# Patient Record
Sex: Female | Born: 1975 | Race: White | Hispanic: No | Marital: Single | Smoking: Current some day smoker
Health system: Southern US, Community
[De-identification: ages and names within clinical notes are randomized; demographics above are authoritative.]

## PROBLEM LIST (undated history)

## (undated) DIAGNOSIS — Z720 Tobacco use: Secondary | ICD-10-CM

## (undated) DIAGNOSIS — R002 Palpitations: Secondary | ICD-10-CM

## (undated) HISTORY — DX: Palpitations: R00.2

## (undated) HISTORY — DX: Tobacco use: Z72.0

---

## 2013-09-13 HISTORY — PX: BREAST BIOPSY: SHX20

## 2016-12-27 ENCOUNTER — Other Ambulatory Visit: Payer: Self-pay | Admitting: Unknown Physician Specialty

## 2016-12-27 DIAGNOSIS — Z1231 Encounter for screening mammogram for malignant neoplasm of breast: Secondary | ICD-10-CM

## 2017-01-13 ENCOUNTER — Encounter: Payer: Self-pay | Admitting: Radiology

## 2017-01-13 ENCOUNTER — Ambulatory Visit
Admission: RE | Admit: 2017-01-13 | Discharge: 2017-01-13 | Disposition: A | Discharge status: Home / Self Care | Payer: BC Managed Care – PPO | Source: Ambulatory Visit | Attending: Unknown Physician Specialty | Admitting: Unknown Physician Specialty

## 2017-01-13 DIAGNOSIS — Z1231 Encounter for screening mammogram for malignant neoplasm of breast: Secondary | ICD-10-CM

## 2017-01-17 ENCOUNTER — Other Ambulatory Visit: Payer: Self-pay | Admitting: Unknown Physician Specialty

## 2017-01-17 DIAGNOSIS — R928 Other abnormal and inconclusive findings on diagnostic imaging of breast: Secondary | ICD-10-CM

## 2017-01-28 ENCOUNTER — Other Ambulatory Visit: Payer: BC Managed Care – PPO

## 2017-01-28 ENCOUNTER — Ambulatory Visit
Admission: RE | Admit: 2017-01-28 | Discharge: 2017-01-28 | Disposition: A | Discharge status: Home / Self Care | Payer: BC Managed Care – PPO | Source: Ambulatory Visit | Attending: Unknown Physician Specialty | Admitting: Unknown Physician Specialty

## 2017-01-28 ENCOUNTER — Other Ambulatory Visit: Payer: Self-pay | Admitting: Unknown Physician Specialty

## 2017-01-28 DIAGNOSIS — R928 Other abnormal and inconclusive findings on diagnostic imaging of breast: Secondary | ICD-10-CM

## 2017-01-28 DIAGNOSIS — N6320 Unspecified lump in the left breast, unspecified quadrant: Secondary | ICD-10-CM

## 2017-07-08 ENCOUNTER — Ambulatory Visit (INDEPENDENT_AMBULATORY_CARE_PROVIDER_SITE_OTHER): Payer: BC Managed Care – PPO

## 2017-07-08 ENCOUNTER — Ambulatory Visit (INDEPENDENT_AMBULATORY_CARE_PROVIDER_SITE_OTHER): Payer: BC Managed Care – PPO | Admitting: Orthopaedic Surgery

## 2017-07-08 DIAGNOSIS — S52591A Other fractures of lower end of right radius, initial encounter for closed fracture: Secondary | ICD-10-CM

## 2017-07-08 DIAGNOSIS — S52501A Unspecified fracture of the lower end of right radius, initial encounter for closed fracture: Secondary | ICD-10-CM

## 2017-07-08 HISTORY — DX: Unspecified fracture of the lower end of right radius, initial encounter for closed fracture: S52.501A

## 2017-07-08 MED ORDER — TRAMADOL HCL 50 MG PO TABS: 50.0000 mg | ORAL_TABLET | Freq: Three times a day (TID) | ORAL | 2 refills | Status: DC | PRN

## 2017-07-08 NOTE — Progress Notes (Signed)
Office Visit Note   Patient: Megan BalloonStephanie Cambre           Date of Birth: Feb 02, 1976           MRN: [id number] Visit Date: 07/08/2017              Requested by: No referring provider defined for this encounter. PCP: System, Pcp Not In   Assessment & Plan: Visit Diagnoses:  1. Other closed fracture of distal end of right radius, initial encounter     Plan: Impression is minimally displaced dorsal cortex fracture of the distal radius.  This should heal fine without any surgery.  Continue to mobilize in a removable wrist splint.  Prescription for tramadol.  Continue with ice and Advil as needed.  Follow-up as needed.  Follow-Up Instructions: Return if symptoms worsen or fail to improve.   Orders:  Orders Placed This Encounter  Procedures  . XR Wrist Complete Right   Meds ordered this encounter  Medications  . traMADol (ULTRAM) 50 MG tablet    Sig: Take 1-2 tablets (50-100 mg total) by mouth 3 (three) times daily as needed.    Dispense:  30 tablet    Refill:  2      Procedures: No procedures performed   Clinical Data: No additional findings.   Subjective: No chief complaint on file.   Patient is a 41 year old fell on outstretched hand yesterday and was evaluated in the urgent care.  She comes in today for second opinion.  She feels that she continues to have significant pain does not feel right.  She takes ice and Advil as needed.  She is complaining of pain and swelling and bruising.  Denies any numbness and tingling.    Review of Systems  Constitutional: Negative.   HENT: Negative.   Eyes: Negative.   Respiratory: Negative.   Cardiovascular: Negative.   Endocrine: Negative.   Musculoskeletal: Negative.   Neurological: Negative.   Hematological: Negative.   Psychiatric/Behavioral: Negative.   All other systems reviewed and are negative.    Objective: Vital Signs: There were no vitals taken for this visit.  Physical Exam  Constitutional: She is  oriented to person, place, and time. She appears well-developed and well-nourished.  HENT:  Head: Normocephalic and atraumatic.  Eyes: EOM are normal.  Neck: Neck supple.  Pulmonary/Chest: Effort normal.  Abdominal: Soft.  Neurological: She is alert and oriented to person, place, and time.  Skin: Skin is warm. Capillary refill takes less than 2 seconds.  Psychiatric: She has a normal mood and affect. Her behavior is normal. Judgment and thought content normal.  Nursing note and vitals reviewed.   Ortho Exam Right wrist exam shows moderate swelling and bruising.  She has significant tenderness over the dorsal aspect of her distal radius.  Flexor and extensor function intact.  Hand is warm and well perfused Specialty Comments:  No specialty comments available.  Imaging: Xr Wrist Complete Right  Result Date: 07/08/2017 Small dorsal cortical irregularity of the distal radius    PMFS History: Patient Active Problem List   Diagnosis Date Noted  . Closed fracture of right distal radius 07/08/2017   No past medical history on file.  No family history on file.  Past Surgical History:  Procedure Laterality Date  . BREAST BIOPSY Bilateral 2015   Cyst    Social History   Occupational History  . Not on file.   Social History Main Topics  . Smoking status: Not on file  . Smokeless  tobacco: Not on file  . Alcohol use Not on file  . Drug use: Unknown  . Sexual activity: Not on file

## 2017-08-01 ENCOUNTER — Ambulatory Visit
Admission: RE | Admit: 2017-08-01 | Discharge: 2017-08-01 | Disposition: A | Discharge status: Home / Self Care | Payer: BC Managed Care – PPO | Source: Ambulatory Visit | Attending: Unknown Physician Specialty | Admitting: Unknown Physician Specialty

## 2017-08-01 ENCOUNTER — Other Ambulatory Visit: Payer: BC Managed Care – PPO

## 2017-08-01 ENCOUNTER — Other Ambulatory Visit: Payer: Self-pay | Admitting: Unknown Physician Specialty

## 2017-08-01 DIAGNOSIS — N6320 Unspecified lump in the left breast, unspecified quadrant: Secondary | ICD-10-CM

## 2018-01-30 ENCOUNTER — Other Ambulatory Visit: Payer: Self-pay | Admitting: Unknown Physician Specialty

## 2018-01-30 ENCOUNTER — Ambulatory Visit
Admission: RE | Admit: 2018-01-30 | Discharge: 2018-01-30 | Disposition: A | Discharge status: Home / Self Care | Payer: BC Managed Care – PPO | Source: Ambulatory Visit | Attending: Unknown Physician Specialty | Admitting: Unknown Physician Specialty

## 2018-01-30 DIAGNOSIS — N6320 Unspecified lump in the left breast, unspecified quadrant: Secondary | ICD-10-CM

## 2019-02-28 ENCOUNTER — Other Ambulatory Visit: Payer: Self-pay | Admitting: Unknown Physician Specialty

## 2019-02-28 DIAGNOSIS — N6320 Unspecified lump in the left breast, unspecified quadrant: Secondary | ICD-10-CM

## 2019-03-09 ENCOUNTER — Ambulatory Visit
Admission: RE | Admit: 2019-03-09 | Discharge: 2019-03-09 | Disposition: A | Discharge status: Home / Self Care | Payer: BC Managed Care – PPO | Source: Ambulatory Visit | Attending: Unknown Physician Specialty | Admitting: Unknown Physician Specialty

## 2019-03-09 ENCOUNTER — Other Ambulatory Visit: Payer: Self-pay

## 2019-03-09 DIAGNOSIS — N6320 Unspecified lump in the left breast, unspecified quadrant: Secondary | ICD-10-CM

## 2020-07-14 ENCOUNTER — Other Ambulatory Visit: Payer: Self-pay | Admitting: Unknown Physician Specialty

## 2020-07-14 DIAGNOSIS — Z0000 Encounter for general adult medical examination without abnormal findings: Secondary | ICD-10-CM

## 2020-08-20 ENCOUNTER — Other Ambulatory Visit: Payer: Self-pay

## 2020-08-20 ENCOUNTER — Ambulatory Visit
Admission: RE | Admit: 2020-08-20 | Discharge: 2020-08-20 | Disposition: A | Discharge status: Home / Self Care | Payer: BC Managed Care – PPO | Source: Ambulatory Visit | Attending: Unknown Physician Specialty | Admitting: Unknown Physician Specialty

## 2020-08-20 DIAGNOSIS — Z0000 Encounter for general adult medical examination without abnormal findings: Secondary | ICD-10-CM

## 2021-07-13 ENCOUNTER — Other Ambulatory Visit: Payer: Self-pay | Admitting: Unknown Physician Specialty

## 2021-07-13 DIAGNOSIS — Z1231 Encounter for screening mammogram for malignant neoplasm of breast: Secondary | ICD-10-CM

## 2021-08-21 ENCOUNTER — Ambulatory Visit: Payer: BC Managed Care – PPO

## 2021-09-17 ENCOUNTER — Ambulatory Visit
Admission: RE | Admit: 2021-09-17 | Discharge: 2021-09-17 | Disposition: A | Discharge status: Home / Self Care | Payer: BC Managed Care – PPO | Source: Ambulatory Visit | Attending: Unknown Physician Specialty | Admitting: Unknown Physician Specialty

## 2021-09-17 DIAGNOSIS — Z1231 Encounter for screening mammogram for malignant neoplasm of breast: Secondary | ICD-10-CM

## 2021-09-18 ENCOUNTER — Other Ambulatory Visit: Payer: Self-pay | Admitting: Unknown Physician Specialty

## 2021-09-18 DIAGNOSIS — R928 Other abnormal and inconclusive findings on diagnostic imaging of breast: Secondary | ICD-10-CM

## 2021-10-24 ENCOUNTER — Other Ambulatory Visit: Payer: Self-pay | Admitting: Unknown Physician Specialty

## 2021-10-24 ENCOUNTER — Ambulatory Visit
Admission: RE | Admit: 2021-10-24 | Discharge: 2021-10-24 | Disposition: A | Discharge status: Home / Self Care | Payer: BC Managed Care – PPO | Source: Ambulatory Visit | Attending: Unknown Physician Specialty | Admitting: Unknown Physician Specialty

## 2021-10-24 DIAGNOSIS — R921 Mammographic calcification found on diagnostic imaging of breast: Secondary | ICD-10-CM

## 2021-10-24 DIAGNOSIS — R928 Other abnormal and inconclusive findings on diagnostic imaging of breast: Secondary | ICD-10-CM

## 2022-04-26 ENCOUNTER — Other Ambulatory Visit: Payer: Self-pay | Admitting: Unknown Physician Specialty

## 2022-04-26 ENCOUNTER — Ambulatory Visit
Admission: RE | Admit: 2022-04-26 | Discharge: 2022-04-26 | Disposition: A | Discharge status: Home / Self Care | Payer: BC Managed Care – PPO | Source: Ambulatory Visit | Attending: Unknown Physician Specialty | Admitting: Unknown Physician Specialty

## 2022-04-26 DIAGNOSIS — R921 Mammographic calcification found on diagnostic imaging of breast: Secondary | ICD-10-CM

## 2022-06-13 IMAGING — MG MM DIGITAL DIAGNOSTIC UNILAT*R*
5 series · 5 of 5 positions shown · non-contrast
Comparison: Previous exam(s).

CLINICAL DATA: Recall from screening mammography, calcifications
involving the upper outer RIGHT breast.

EXAM:
DIGITAL DIAGNOSTIC UNILATERAL RIGHT MAMMOGRAM
TECHNIQUE: Right digital diagnostic mammography was performed. Mammographic
images were processed with CAD.

[R ML (1 of 3)]
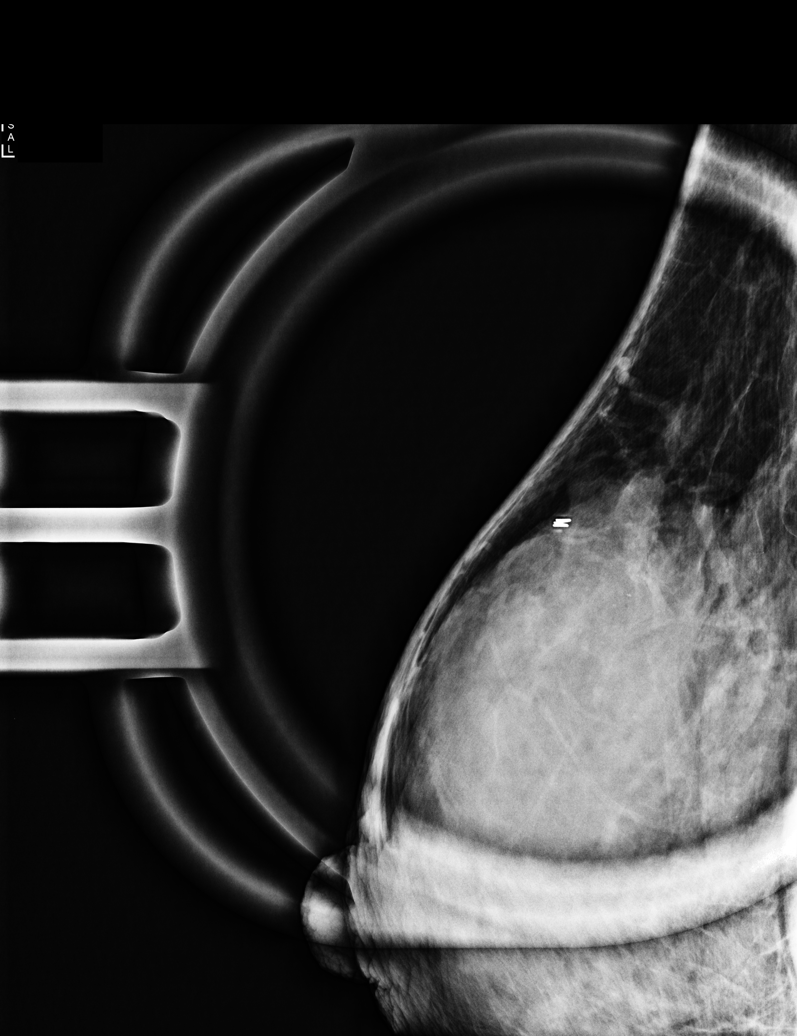

[R CC (1 of 2)]
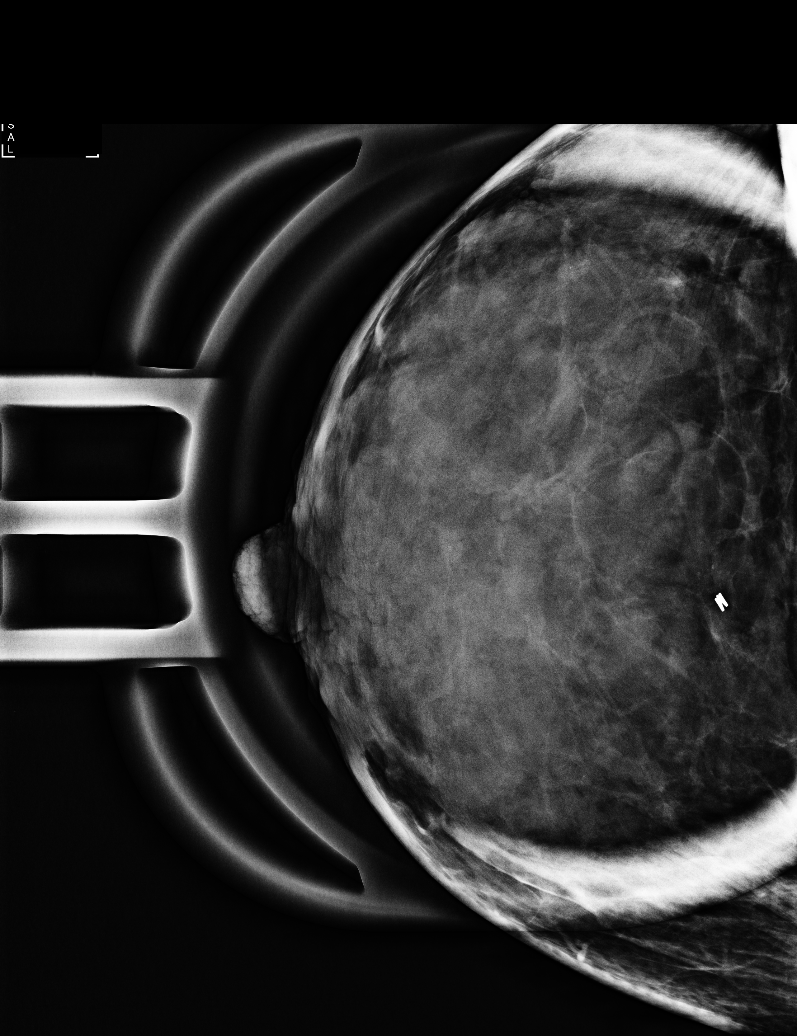

[R ML (2 of 3)]
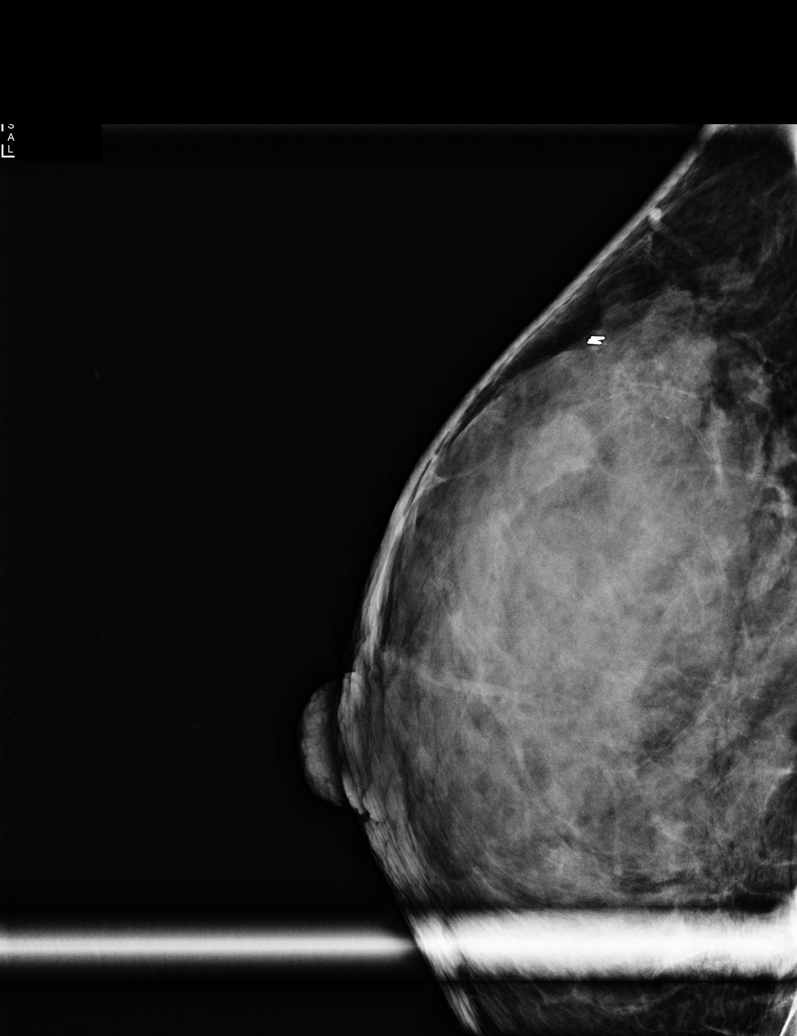

[R CC (2 of 2)]
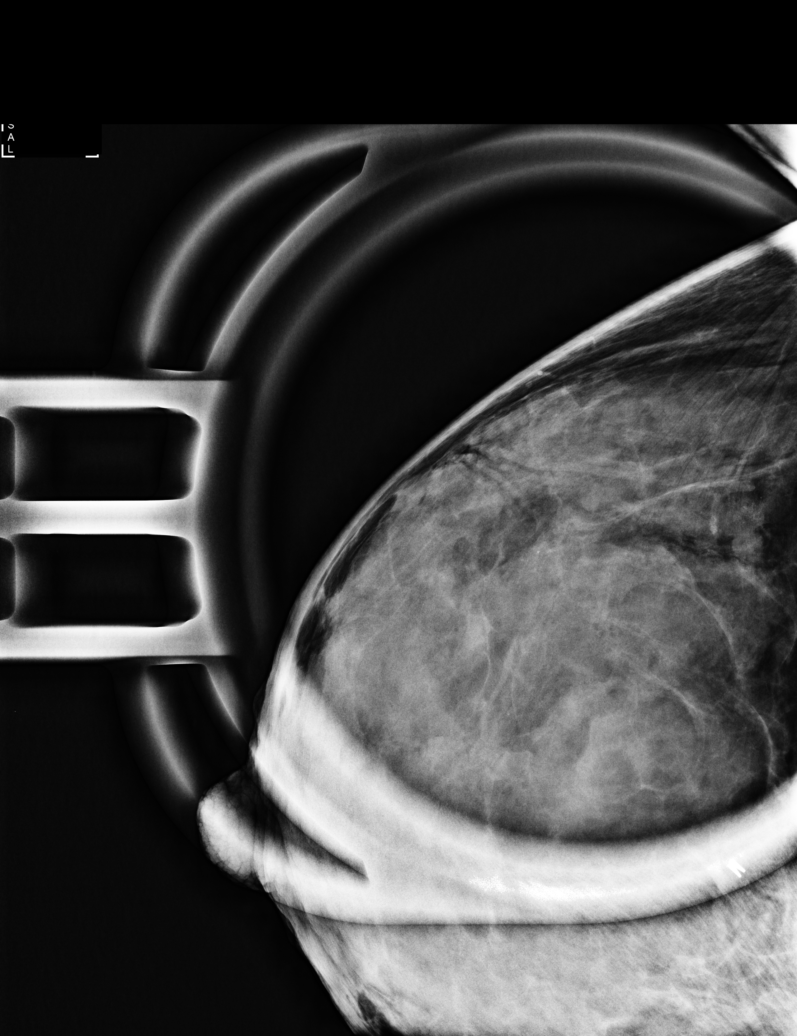

[R ML (3 of 3)]
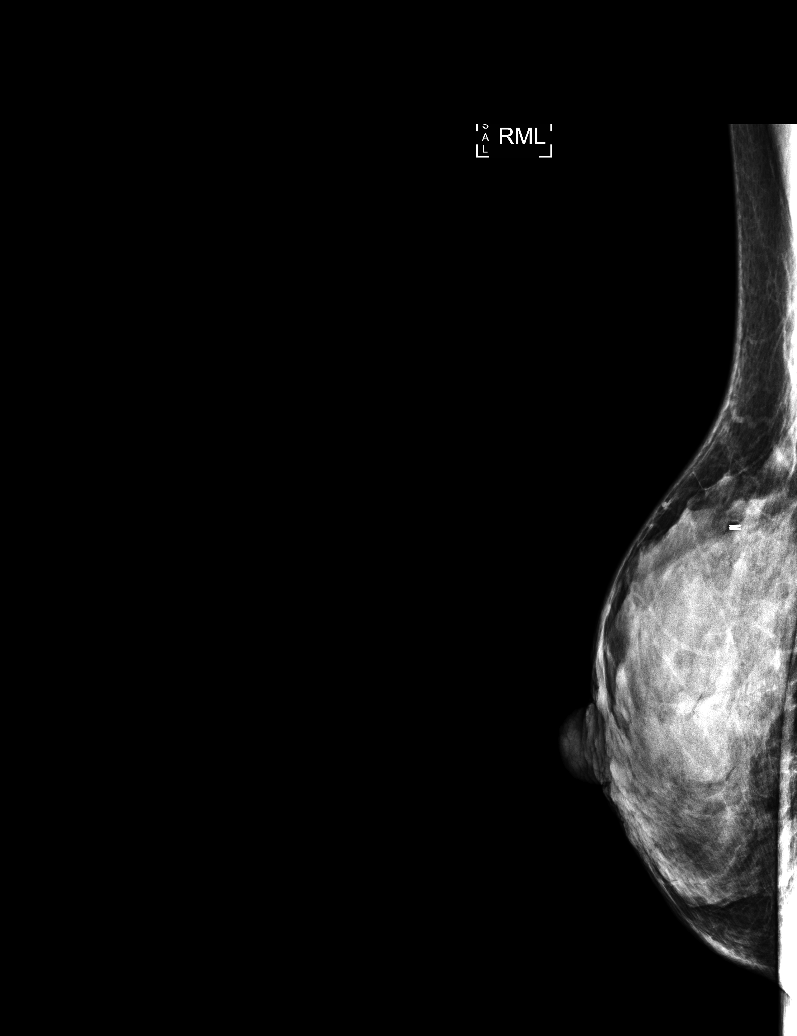

[5 of 5 positions shown; findings below may reference images not displayed]

ACR Breast Density Category d: The breast tissue is extremely dense,
which lowers the sensitivity of mammography.
FINDINGS: Spot magnification CC and mediolateral views of the calcifications
and a full field mediolateral view were obtained.

Spot magnification views confirm a group of faint punctate and
amorphous calcifications involving the UPPER OUTER QUADRANT at
posterior depth spanning approximately 1.2 cm, many of which
demonstrate layering on the mediolateral view. A second group of
loosely grouped similar appearing punctate and amorphous
calcifications is present inferior and medial to this larger group,
spanning approximately 0.9 cm, many of which also demonstrate
layering. There are no suspicious linear or branching forms in
either group.
IMPRESSION: Two groups of likely benign calcifications involving the UPPER OUTER
QUADRANT of the RIGHT breast.

RECOMMENDATION:
Diagnostic RIGHT mammogram (to include spot magnification views of
the calcifications) in 6 months.

I have discussed the findings and recommendations with the patient.
If applicable, a reminder letter will be sent to the patient
regarding the next appointment.

BI-RADS CATEGORY  3: Probably benign.

## 2022-09-20 ENCOUNTER — Ambulatory Visit
Admission: RE | Admit: 2022-09-20 | Discharge: 2022-09-20 | Disposition: A | Discharge status: Home / Self Care | Payer: BC Managed Care – PPO | Source: Ambulatory Visit | Attending: Unknown Physician Specialty | Admitting: Unknown Physician Specialty

## 2022-09-20 DIAGNOSIS — R921 Mammographic calcification found on diagnostic imaging of breast: Secondary | ICD-10-CM

## 2023-08-31 ENCOUNTER — Other Ambulatory Visit: Payer: Self-pay | Admitting: Unknown Physician Specialty

## 2023-08-31 DIAGNOSIS — R921 Mammographic calcification found on diagnostic imaging of breast: Secondary | ICD-10-CM

## 2023-09-27 ENCOUNTER — Ambulatory Visit
Admission: RE | Admit: 2023-09-27 | Discharge: 2023-09-27 | Disposition: A | Discharge status: Home / Self Care | Payer: 59 | Source: Ambulatory Visit | Attending: Unknown Physician Specialty | Admitting: Unknown Physician Specialty

## 2023-09-27 DIAGNOSIS — R921 Mammographic calcification found on diagnostic imaging of breast: Secondary | ICD-10-CM

## 2024-02-13 DIAGNOSIS — Z720 Tobacco use: Secondary | ICD-10-CM | POA: Insufficient documentation

## 2024-02-13 DIAGNOSIS — R002 Palpitations: Secondary | ICD-10-CM | POA: Insufficient documentation

## 2024-02-14 ENCOUNTER — Ambulatory Visit

## 2024-02-14 VITALS — BP 106/74 | HR 70 | Ht 69.6 in | Wt 137.1 lb

## 2024-02-14 DIAGNOSIS — I4729 Other ventricular tachycardia: Secondary | ICD-10-CM

## 2024-02-14 DIAGNOSIS — R002 Palpitations: Secondary | ICD-10-CM | POA: Diagnosis not present

## 2024-02-14 DIAGNOSIS — I491 Atrial premature depolarization: Secondary | ICD-10-CM

## 2024-02-14 HISTORY — DX: Other ventricular tachycardia: I47.29

## 2024-02-14 HISTORY — DX: Atrial premature depolarization: I49.1

## 2024-02-14 NOTE — Patient Instructions (Signed)
 Medication Instructions:  Your physician recommends that you continue on your current medications as directed. Please refer to the Current Medication list given to you today.  *If you need a refill on your cardiac medications before your next appointment, please call your pharmacy*  Lab Work: None If you have labs (blood work) drawn today and your tests are completely normal, you will receive your results only by: MyChart Message (if you have MyChart) OR A paper copy in the mail If you have any lab test that is abnormal or we need to change your treatment, we will call you to review the results.  Testing/Procedures: Your physician has requested that you have an echocardiogram. Echocardiography is a painless test that uses sound waves to create images of your heart. It provides your doctor with information about the size and shape of your heart and how well your heart's chambers and valves are working. This procedure takes approximately one hour. There are no restrictions for this procedure. Please do NOT wear cologne, perfume, aftershave, or lotions (deodorant is allowed). Please arrive 15 minutes prior to your appointment time.  Please note: We ask at that you not bring children with you during ultrasound (echo/ vascular) testing. Due to room size and safety concerns, children are not allowed in the ultrasound rooms during exams. Our front office staff cannot provide observation of children in our lobby area while testing is being conducted. An adult accompanying a patient to their appointment will only be allowed in the ultrasound room at the discretion of the ultrasound technician under special circumstances. We apologize for any inconvenience.  Treadmill Stress Test Instructions:    1. You may take all of your medications.  2. No food, drink or tobacco products 2 hours prior to your test.  3. Dress prepared to exercise. Best to wear 2 piece outfit and tennis shoes. Shoes must be closed  toe.  4. Please bring all current prescription medications.   Follow-Up: At [facility], you and your health needs are our priority.  As part of our continuing mission to provide you with exceptional heart care, our providers are all part of one team.  This team includes your primary Cardiologist (physician) and Advanced Practice Providers or APPs (Physician Assistants and Nurse Practitioners) who all work together to provide you with the care you need, when you need it.  Your next appointment:   Follow up as needed based on test results.  Provider:   Bertha Broad, MD    We recommend signing up for the patient portal called "MyChart".  Sign up information is provided on this After Visit Summary.  MyChart is used to connect with patients for Virtual Visits (Telemedicine).  Patients are able to view lab/test results, encounter notes, upcoming appointments, etc.  Non-urgent messages can be sent to your provider as well.   To learn more about what you can do with MyChart, go to [email or web address].   Other Instructions None

## 2024-02-14 NOTE — Progress Notes (Signed)
 Cardiology Consultation:    Date:  02/14/2024   ID:  Megan Terry, DOB 07/30/1976, MRN [id number]  PCP:  Orlena Bitters, DO  Cardiologist:  Angelena Kells, MD   Referring MD: Lazoff, Shawn P, DO   No chief complaint on file.    ASSESSMENT AND PLAN:   Megan Terry 48 year old woman with history of smoking, mild anxiety, not on any regular medications, no prior significant cardiac history has reported symptoms of palpitations of which are described as flutter like sensation in the chest and heart monitor for 14 days done recently from February 2025 noted a run of NSVT lasting 5 beats and 2 short runs of SVT with the longest episode lasting 5 beats and overall less than 1% ventricular and supraventricular ectopy burden with symptoms reported associated with sinus rhythm and at times with isolated ventricular and supraventricular ectopic beats.   Problem List Items Addressed This Visit     Heart palpitations - Primary   Reviewed heart monitor results as below. In setting of short run of NSVT, recommend further evaluation with transthoracic echocardiogram for cardiac structure and function assessment. Also will obtain a treadmill exercise stress test to assess for any triggered arrhythmias.  Notably her average heart rate is in the 90s. Recommended she maintain herself well-hydrated through the day and cut down caffeine intake.   Will review results from echocardiogram and treadmill stress test once available. Further follow-up in the office with us  based on test results.  Will defer to PCP to follow-up on any significant thyroid abnormalities of the thyroid panel.        Relevant Orders   EKG 12-Lead (Completed)   ECHOCARDIOGRAM COMPLETE   Exercise Tolerance Test   NSVT (nonsustained ventricular tachycardia) (HCC)   PAC (premature atrial contraction)      History of Present Illness:    Megan Terry is a 48 y.o. female who is being seen today for  the evaluation of palpitations at the request of Lazoff, Shawn P, DO.  History of palpitations and heart monitor result from February 2025 as reviewed below, smokes tobacco, mild anxiety. No prior history of cardiac disease and prior cardiac workup with echocardiogram or stress test.  Pleasant woman here for the visit by herself.  Works as an Production designer, theatre/television/film for [employer].  Keeps herself busy with hiking on the weekends and regularly walks up to a mile.  Had reportedly noticed symptoms of palpitations which she describes as a sense of extra beats or fluttering in the chest that can last for few seconds and typically noticeable when she is at rest and towards the evenings. Denies any symptoms with activity.  Drinks a Venti size coffee in the mornings with espresso. Keeps herself well-hydrated rest of the day.  EKG in the clinic today shows sinus rhythm heart rate 70/min, PR interval 138 ms, QRS duration 80 ms, QTc 408 ms, normal no ST-T changes to suggest ischemia.    Heart monitor results, interpretation available from 10-26-2023 at [facility] reviewed in her chart mention predominantly sinus rhythm with average heart rate 91/min [ranging from 53 to 214 bpm] 1 short run of NSVT lasting 5 beats was reported.  2 short runs of SVT with the longest episode lasting 5 beats was reported.  Overall ventricular and supraventricular ectopy burden was less than 1%.  There was 1 ventricular triplet reported.  And interpretation noted that the SVT runs reported were consecutive PACs.  Triggered events were reportedly associated with  sinus rhythm and with rare isolated PACs and PVCs.  Overall benign findings per report.  Blood work from 11-01-2023 total cholesterol 182, triglycerides 58, HDL 56, LDL 112. CMP unremarkable with BUN 14, creatinine 0.75, EGFR greater than 90. Normal transaminases and alkaline phosphatase. Hemoglobin 14.1, hematocrit 40.8, WBC 7.6, platelets 372. No recent thyroid  panel available.  Past Medical History:  Diagnosis Date   Closed fracture of right distal radius 07/08/2017   Heart palpitations    Tobacco abuse     Past Surgical History:  Procedure Laterality Date   BREAST BIOPSY Bilateral 2015   Cyst     Current Medications: No outpatient medications have been marked as taking for the 02/14/24 encounter (Office Visit) with Terris Germano, Daymon Evans, MD.     Allergies:   Penicillins and Codeine   Social History   Socioeconomic History   Marital status: Single    Spouse name: Not on file   Number of children: Not on file   Years of education: Not on file   Highest education level: Not on file  Occupational History   Not on file  Tobacco Use   Smoking status: Some Days    Types: Cigarettes   Smokeless tobacco: Never  Substance and Sexual Activity   Alcohol use: Not on file   Drug use: Not on file   Sexual activity: Not on file  Other Topics Concern   Not on file  Social History Narrative   Not on file   Social Drivers of Health   Financial Resource Strain: Not on file  Food Insecurity: Medium Risk (10/21/2023)   Received from [facility]   Hunger Vital Sign    Worried About Running Out of Food in the Last Year: Sometimes true    Ran Out of Food in the Last Year: Sometimes true  Transportation Needs: No Transportation Needs (10/21/2023)   Received from [facility]    In the past 12 months, has lack of reliable transportation kept you from medical appointments, meetings, work or from getting things needed for daily living? : No  Physical Activity: Not on file  Stress: Not on file  Social Connections: Not on file     Family History: The patient's family history includes Arthritis in her mother and sister; Depression in her brother; Diabetes in her father; Hypertension in her father and mother. ROS:   Please see the history of present illness.    All 14 point review of systems negative except as described per  history of present illness.  EKGs/Labs/Other Studies Reviewed:    The following studies were reviewed today:   EKG:  EKG Interpretation Date/Time:  Tuesday February 14 2024 10:50:09 EDT Ventricular Rate:  70 PR Interval:  138 QRS Duration:  80 QT Interval:  378 QTC Calculation: 408 R Axis:   87  Text Interpretation: Normal sinus rhythm with sinus arrhythmia Normal ECG No previous ECGs available Confirmed by Bertha Broad reddy [phone]) on 02/14/2024 11:16:29 AM    Recent Labs: No results found for requested labs within last 365 days.  Recent Lipid Panel No results found for: "CHOL", "TRIG", "HDL", "CHOLHDL", "VLDL", "LDLCALC", "LDLDIRECT"  Physical Exam:    VS:  BP 106/74   Pulse 70   Ht 5' 9.6" (1.768 m)   Wt 137 lb 1.3 oz (62.2 kg)   SpO2 98%   BMI 19.90 kg/m     Wt Readings from Last 3 Encounters:  02/14/24 137 lb 1.3 oz (62.2 kg)  GENERAL:  Well nourished, well developed in no acute distress NECK: No JVD; No carotid bruits CARDIAC: RRR, S1 and S2 present, no murmurs, no rubs, no gallops Extremities: No pitting pedal edema. Pulses bilaterally symmetric with radial 2+ and dorsalis pedis 2+ NEUROLOGIC:  Alert and oriented x 3  Medication Adjustments/Labs and Tests Ordered: Current medicines are reviewed at length with the patient today.  Concerns regarding medicines are outlined above.  Orders Placed This Encounter  Procedures   Exercise Tolerance Test   EKG 12-Lead   ECHOCARDIOGRAM COMPLETE   No orders of the defined types were placed in this encounter.   Signed, Lura Sallies, MD, MPH, [facility]. 02/14/2024 12:10 PM    [facility]

## 2024-02-14 NOTE — Assessment & Plan Note (Addendum)
 Reviewed heart monitor results as below. In setting of short run of NSVT, recommend further evaluation with transthoracic echocardiogram for cardiac structure and function assessment. Also will obtain a treadmill exercise stress test to assess for any triggered arrhythmias.  Notably her average heart rate is in the 90s. Recommended she maintain herself well-hydrated through the day and cut down caffeine intake.   Will review results from echocardiogram and treadmill stress test once available. Further follow-up in the office with us  based on test results.  Will defer to PCP to follow-up on any significant thyroid abnormalities of the thyroid panel.

## 2024-04-02 ENCOUNTER — Ambulatory Visit (HOSPITAL_COMMUNITY)

## 2024-04-02 ENCOUNTER — Ambulatory Visit (HOSPITAL_COMMUNITY): Admission: RE | Admit: 2024-04-02 | Source: Ambulatory Visit

## 2024-04-26 ENCOUNTER — Encounter (HOSPITAL_COMMUNITY): Payer: Self-pay | Admitting: *Deleted

## 2024-05-09 NOTE — Addendum Note (Signed)
 Addended by: LIBORIO HAI REDDY on: 05/09/2024 09:41 AM   Modules accepted: Orders

## 2024-05-10 ENCOUNTER — Ambulatory Visit (HOSPITAL_BASED_OUTPATIENT_CLINIC_OR_DEPARTMENT_OTHER)
Admission: RE | Admit: 2024-05-10 | Discharge: 2024-05-10 | Disposition: A | Discharge status: Home / Self Care | Source: Ambulatory Visit

## 2024-05-10 ENCOUNTER — Ambulatory Visit (HOSPITAL_COMMUNITY)
Admission: RE | Admit: 2024-05-10 | Discharge: 2024-05-10 | Disposition: A | Discharge status: Home / Self Care | Source: Ambulatory Visit | Attending: Cardiology | Admitting: Cardiology

## 2024-05-10 ENCOUNTER — Ambulatory Visit: Payer: Self-pay

## 2024-05-10 DIAGNOSIS — I349 Nonrheumatic mitral valve disorder, unspecified: Secondary | ICD-10-CM

## 2024-05-10 DIAGNOSIS — R002 Palpitations: Secondary | ICD-10-CM

## 2024-05-10 DIAGNOSIS — I472 Ventricular tachycardia, unspecified: Secondary | ICD-10-CM

## 2024-05-10 DIAGNOSIS — I340 Nonrheumatic mitral (valve) insufficiency: Secondary | ICD-10-CM

## 2024-05-10 DIAGNOSIS — I493 Ventricular premature depolarization: Secondary | ICD-10-CM

## 2024-05-10 LAB — EXERCISE TOLERANCE TEST
Angina Index: 0
Duke Treadmill Score: 10
Estimated workload: 11.7
Exercise duration (min): 10 min
Exercise duration (sec): 0 s
MPHR: 171 {beats}/min
Peak HR: 148 {beats}/min
Percent HR: 86 %
Rest HR: 84 {beats}/min
ST Depression (mm): 0 mm

## 2024-05-10 NOTE — Progress Notes (Signed)
 Please inform abnormal test results she will note findings concerning for reduced blood flow to the heart muscle with exercise.  Excellent exercise capacity and normal heart rate and blood pressure response. There were notably more frequent extra beats occurring from lower chambers of the heart at peak exercise and in early recovery which can reflect a tendency for triggered extra beats with activity.  I will review the echocardiogram results once completed and available. I would recommend starting a low-dose of beta-blocker such as metoprolol  tartrate 12.5 mg twice daily and referral to electrophysiologist to further review these test results and optimizing medical therapy.

## 2024-05-11 DIAGNOSIS — I340 Nonrheumatic mitral (valve) insufficiency: Secondary | ICD-10-CM

## 2024-05-11 DIAGNOSIS — I493 Ventricular premature depolarization: Secondary | ICD-10-CM | POA: Insufficient documentation

## 2024-05-11 HISTORY — DX: Nonrheumatic mitral (valve) insufficiency: I34.0

## 2024-05-11 HISTORY — DX: Ventricular premature depolarization: I49.3

## 2024-05-11 LAB — ECHOCARDIOGRAM COMPLETE
Area-P 1/2: 4.19 cm2
S' Lateral: 3 cm

## 2024-05-11 MED ORDER — METOPROLOL TARTRATE 25 MG PO TABS: 12.5000 mg | ORAL_TABLET | Freq: Two times a day (BID) | ORAL | 3 refills | Status: AC

## 2024-05-11 NOTE — Telephone Encounter (Signed)
-----   Message from Marlin R Madireddy sent at 05/10/2024  5:21 PM EDT ----- Please inform abnormal test results she will note findings concerning for reduced blood flow to the heart muscle with exercise.  Excellent exercise capacity and normal heart rate and blood pressure  response. There were notably more frequent extra beats occurring from lower chambers of the heart at peak exercise and in early recovery which can reflect a tendency for triggered extra beats with activity.  I will review the echocardiogram results once completed and available. I would recommend starting a low-dose of beta-blocker such as metoprolol  tartrate 12.5 mg twice daily and referral to electrophysiologist to further review these test results and optimizing medical  therapy.   ----- Message ----- From: Raford Riggs, MD Sent: 05/10/2024   2:30 PM EDT To: Alean JONELLE Kobus, MD

## 2024-05-11 NOTE — Telephone Encounter (Signed)
 MyCHart Message

## 2024-05-15 ENCOUNTER — Telehealth: Payer: Self-pay

## 2024-05-15 NOTE — Telephone Encounter (Signed)
 Pt viewed Stress Test results on My Chart per Dr. Vanetta Shawl note. Routed to PCP.

## 2024-05-22 NOTE — Telephone Encounter (Signed)
-----   Message from Marlin R Madireddy sent at 05/10/2024  5:21 PM EDT ----- Please inform abnormal test results she will note findings concerning for reduced blood flow to the heart muscle with exercise.  Excellent exercise capacity and normal heart rate and blood pressure  response. There were notably more frequent extra beats occurring from lower chambers of the heart at peak exercise and in early recovery which can reflect a tendency for triggered extra beats with activity.  I will review the echocardiogram results once completed and available. I would recommend starting a low-dose of beta-blocker such as metoprolol  tartrate 12.5 mg twice daily and referral to electrophysiologist to further review these test results and optimizing medical  therapy.   ----- Message ----- From: Raford Riggs, MD Sent: 05/10/2024   2:30 PM EDT To: Megan JONELLE Kobus, MD

## 2024-08-15 ENCOUNTER — Ambulatory Visit

## 2024-08-23 ENCOUNTER — Ambulatory Visit

## 2024-08-23 VITALS — BP 102/64 | HR 90 | Ht 69.0 in | Wt 131.4 lb

## 2024-08-23 DIAGNOSIS — I340 Nonrheumatic mitral (valve) insufficiency: Secondary | ICD-10-CM

## 2024-08-23 DIAGNOSIS — I493 Ventricular premature depolarization: Secondary | ICD-10-CM | POA: Diagnosis not present

## 2024-08-23 DIAGNOSIS — R9439 Abnormal result of other cardiovascular function study: Secondary | ICD-10-CM | POA: Insufficient documentation

## 2024-08-23 DIAGNOSIS — Z720 Tobacco use: Secondary | ICD-10-CM

## 2024-08-23 NOTE — Assessment & Plan Note (Signed)
 with frequent PVCs and peak stress and early recovery, reviewed the results and potential trigger from exercise related excitation versus ischemia.  -Recommended further evaluate with cardiac CT coronary angiogram. Discussed about radiation, contrast use, metoprolol  for rate optimization. Agreeable to proceed. She wishes to proceed with scheduling this tentatively in January which I feel is reasonable.

## 2024-08-23 NOTE — Assessment & Plan Note (Signed)
 Abnormal stress was in the context of frequent PVCs at peak exercise and early recovery valuation. Further ischemic recommended and will schedule for cardiac CT coronary angiogram.

## 2024-08-23 NOTE — Assessment & Plan Note (Signed)
 Mild on echocardiogram.  Not hemodynamically significant. Can follow-up with repeat imaging tentatively in 24 months.

## 2024-08-23 NOTE — Progress Notes (Signed)
 Cardiology Consultation:    Date:  08/23/2024   ID:  Megan Terry, DOB 1976/07/14, MRN [id number]  PCP:  Lazoff, Shawn P, DO  Cardiologist:  Alean JONELLE Kobus, MD   Referring MD: Lazoff, Shawn P, DO   No chief complaint on file.    ASSESSMENT AND PLAN:   Megan Terry 48 year old Terry with history of occasional palpitations. Smokes intermittently and drinks alcohol occasionally on social events.  With abnormal treadmill exercise stress test 05/10/2024 with frequent PVCs and peak stress and early recovery. Echocardiogram 05/10/2024 noted normal biventricular function, wall motion abnormality of mid to apical inferior wall and basal to mid inferoseptal wall segments was reported however not impressive on my review of the images and appears normal.  Problem List Items Addressed This Visit       Cardiovascular and Mediastinum   Mild mitral regurgitation, by TTE 05/10/2024   Mild on echocardiogram.  Not hemodynamically significant. Can follow-up with repeat imaging tentatively in 24 months.      Relevant Medications   metoprolol  tartrate (LOPRESSOR ) 100 MG tablet   Symptomatic PVCs, rare burden less than 1% on heart monitor; frequent PVCs in early recovery after exercise stress test   with frequent PVCs and peak stress and early recovery, reviewed the results and potential trigger from exercise related excitation versus ischemia.  -Recommended further evaluate with cardiac CT coronary angiogram. Discussed about radiation, contrast use, metoprolol  for rate optimization. Agreeable to proceed. She wishes to proceed with scheduling this tentatively in January which I feel is reasonable.      Relevant Medications   metoprolol  tartrate (LOPRESSOR ) 100 MG tablet     Other   Tobacco abuse   Discussed about overall healthy diet and lifestyle. Strongly recommended to quit smoking.      Abnormal stress test - Primary   Abnormal stress was in the context of frequent PVCs  at peak exercise and early recovery valuation. Further ischemic recommended and will schedule for cardiac CT coronary angiogram.      Relevant Orders   Comprehensive metabolic panel with GFR   Lipid panel   CT CORONARY MORPH W/CTA COR W/SCORE W/CA W/CM &/OR WO/CM    Follow-up tentatively in the office in 2 months.    History of Present Illness:    Megan Terry is a 48 y.o. female who is being seen today for follow-up visit today. Last visit with me in the office was 02/14/2024.   PCP is Lazoff, Shawn P, DO.   Megan Terry here for the visit by herself.  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  Physically active walks regularly.  No significant longstanding health issues other than occasional palpitations. Smokes intermittently and drinks alcohol occasionally on social events.  With sensation of palpitations extra beats occurring occasionally, most noticeable at rest presented for further evaluation.  Heart monitor from February 2025 showed no major abnormalities other than 1 short run of NSVT lasting 5 beats.  Further evaluation with echocardiogram, treadmill EKG stress test was recommended  Echocardiogram from 05/10/2024 shows normal biventricular function EF 55 to 60%, there is mild hypokinesis reported of the mid to apical inferior wall and  basal mid inferoseptal segment.  Normal RV size and function.  Review of images myself not impressed with the wall motion of abnormality described.  Wall motion appears relatively normal.  Treadmill exercise stress test 05/10/2024 noted frequent PVCs in a bigeminy pattern at peak exertion and early recovery.  No ST segment deviation.  Good exercise capacity 10 minutes on the treadmill.  Prior Zio patch monitor from 10/26/2023 at [facility] noted sinus rhythm average heart rate 91/min, rare ventricular and supraventricular ectopy burden less than 1%, 1 short run of NSVT lasting 5 beats and 2 short runs of SVT longest  lasting 5 beats.  Symptom triggered events correlated with isolated PACs and PVCs  Last lipid panel to review is from 11/01/2023 total cholesterol 182, triglycerides 58, HDL 56 and LDL 112.  Here today for the visit she mentions overall doing well.  Rare symptoms of palpitations most typically at rest. No syncopal or near syncopal episodes. No other cardiac symptoms.  Good functional capacity and exercise capacity as noted on the treadmill exercise stress test.  She is worried about her overall cardiac health risk.  Metoprolol  that was recommended after the treadmill exercise stress test she did not prefer to start it.    Past Medical History:  Diagnosis Date   Closed fracture of right distal radius 07/08/2017   Heart palpitations    Mild mitral regurgitation, by TTE 05/10/2024 05/11/2024   NSVT (nonsustained ventricular tachycardia) (HCC) 02/14/2024   PAC (premature atrial contraction) 02/14/2024   Symptomatic PVCs, rare burden less than 1% on heart monitor; frequent PVCs in early recovery after exercise stress test 05/11/2024   Tobacco abuse     Past Surgical History:  Procedure Laterality Date   BREAST BIOPSY Bilateral 2015   Cyst     Current Medications: Active Medications[1]   Allergies:   Penicillins and Codeine   Social History   Socioeconomic History   Marital status: Single    Spouse name: Not on file   Number of children: Not on file   Years of education: Not on file   Highest education level: Not on file  Occupational History   Not on file  Tobacco Use   Smoking status: Some Days    Types: Cigarettes   Smokeless tobacco: Never  Substance and Sexual Activity   Alcohol use: Not on file   Drug use: Not on file   Sexual activity: Not on file  Other Topics Concern   Not on file  Social History Narrative   Not on file   Social Drivers of Health   Tobacco Use: High Risk (08/23/2024)   Patient History    Smoking Tobacco Use: Some Days    Smokeless  Tobacco Use: Never    Passive Exposure: Not on file  Financial Resource Strain: Not on file  Food Insecurity: Medium Risk (10/21/2023)   Received from [facility]   Epic    Within the past 12 months, you worried that your food would run out before you got money to buy more: Sometimes true    Within the past 12 months, the food you bought just didn't last and you didn't have money to get more. : Sometimes true  Transportation Needs: No Transportation Needs (10/21/2023)   Received from [facility]    In the past 12 months, has lack of reliable transportation kept you from medical appointments, meetings, work or from getting things needed for daily living? : No  Physical Activity: Not on file  Stress: Not on file  Social Connections: Not on file  Depression (EYV7-0): Not on file  Alcohol Screen: Not on file  Housing: Low Risk (10/21/2023)   Received from [facility]   Epic    What is your living situation today?: I have a steady place to live  Think about the place you live. Do you have problems with any of the following? Choose all that apply:: None/None on this list  Utilities: Low Risk (10/21/2023)   Received from [facility]   Utilities    In the past 12 months has the electric, gas, oil, or water company threatened to shut off services in your home? : No  Health Literacy: Not on file     Family History: The patient's family history includes Arthritis in her mother and sister; Depression in her brother; Diabetes in her father; Hypertension in her father and mother. ROS:   Please see the history of present illness.    All 14 point review of systems negative except as described per history of present illness.  EKGs/Labs/Other Studies Reviewed:    The following studies were reviewed today:   EKG:       Recent Labs: No results found for requested labs within last 365 days.  Recent Lipid Panel No results found for: CHOL, TRIG, HDL, CHOLHDL, VLDL,  LDLCALC, LDLDIRECT  Physical Exam:    VS:  BP 102/64   Pulse 90   Ht 5' 9 (1.753 m)   Wt 131 lb 6.4 oz (59.6 kg)   SpO2 98%   BMI 19.40 kg/m     Wt Readings from Last 3 Encounters:  08/23/24 131 lb 6.4 oz (59.6 kg)  02/14/24 137 lb 1.3 oz (62.2 kg)     GENERAL:  Well nourished, well developed in no acute distress NECK: No JVD CARDIAC: RRR, S1 and S2 present, no murmurs, no rubs, no gallops Extremities: No pitting pedal edema. Pulses bilaterally symmetric with radial 2+ NEUROLOGIC:  Alert and oriented x 3  Medication Adjustments/Labs and Tests Ordered: Current medicines are reviewed at length with the patient today.  Concerns regarding medicines are outlined above.  Orders Placed This Encounter  Procedures   CT CORONARY MORPH W/CTA COR W/SCORE W/CA W/CM &/OR WO/CM   Comprehensive metabolic panel with GFR   Lipid panel   Meds ordered this encounter  Medications   metoprolol  tartrate (LOPRESSOR ) 100 MG tablet    Sig: Take 1 tablet (100 mg total) by mouth once. Please take this medication 2 hours before CT    Dispense:  1 tablet    Refill:  0    Signed, Kaile Bixler reddy Aloura Matsuoka, MD, MPH, [facility]. 08/23/2024 1:32 PM     [facility]     [1]  Current Meds  Medication Sig   metoprolol  tartrate (LOPRESSOR ) 100 MG tablet Take 1 tablet (100 mg total) by mouth once. Please take this medication 2 hours before CT

## 2024-08-23 NOTE — Assessment & Plan Note (Signed)
 Discussed about overall healthy diet and lifestyle. Strongly recommended to quit smoking.

## 2024-08-23 NOTE — Patient Instructions (Signed)
 Medication Instructions:  Your physician recommends that you continue on your current medications as directed. Please refer to the Current Medication list given to you today.  *If you need a refill on your cardiac medications before your next appointment, please call your pharmacy*  Lab Work: Your physician recommends that you return for lab work in:   Labs in early January: CMP, Lipids  If you have labs (blood work) drawn today and your tests are completely normal, you will receive your results only by: [employer] (if you have MyChart) OR A paper copy in the mail If you have any lab test that is abnormal or we need to change your treatment, we will call you to review the results.  Testing/Procedures:   Your cardiac CT will be scheduled at one of the below locations:   [facility] [address] [place], [place] [address] [phone] (Severe contrast allergies only)  OR   [facility] [address] [place], [place] [address] [phone]  OR   [facility] [address] [place], [place] [address] [phone]  OR   [facility] [address]  [place], [place] [address]  OR   [facility] [address] [place], [place] [phone]  If scheduled at [facility], please arrive at the [facility] and Children's Entrance (Entrance C2) of [facility] 30 minutes prior to test start time. You can use the FREE valet parking offered at entrance C (encouraged to control the heart rate for the test)  Proceed to the [facility] Radiology Department (first floor) to check-in and test prep.  All radiology patients and guests should use entrance C2 at [facility], accessed from [facility], even though the hospital's physical address listed is [address]  If scheduled at the Heart and Vascular Tower at [address], please enter the  parking lot using the [address] entrance and use the FREE valet service at the patient drop-off area. Enter the building and check-in with registration on the main floor.  If scheduled at [facility], please arrive to the Heart and Vascular Center 15 mins early for check-in and test prep.  There is spacious parking and easy access to the radiology department from the [facility] and Vascular entrance. Please enter here and check-in with the desk attendant.   If scheduled at [facility], please arrive 30 minutes early for check-in and test prep.  Please follow these instructions carefully (unless otherwise directed):  An IV will be required for this test and Nitroglycerin will be given.  Hold all erectile dysfunction medications at least 3 days (72 hrs) prior to test. (Ie viagra, cialis, sildenafil, tadalafil, etc)   On the Night Before the Test: Be sure to Drink plenty of water. Do not consume any caffeinated/decaffeinated beverages or chocolate 12 hours prior to your test. Do not take any antihistamines 12 hours prior to your test.  On the Day of the Test: Drink plenty of water until 1 hour prior to the test. Do not eat any food 1 hour prior to test. You may take your regular medications prior to the test.  Take metoprolol  (Lopressor ) two hours prior to test. Patients who wear a continuous glucose monitor MUST remove the device prior to scanning. FEMALES- please wear underwire-free bra if available, avoid dresses & tight clothing        After the Test: Drink  plenty of water. After receiving IV contrast, you may experience a mild flushed feeling. This is normal. On occasion, you may experience a mild rash up to 24 hours after the test. This is not dangerous. If this occurs, you can take Benadryl 25 mg, Zyrtec, Claritin, or Allegra and increase your fluid intake. (Patients taking Tikosyn should avoid Benadryl, and may take Zyrtec, Claritin, or  Allegra) If you experience trouble breathing, this can be serious. If it is severe call 911 IMMEDIATELY. If it is mild, please call our office.  We will call to schedule your test 2-4 weeks out understanding that some insurance companies will need an authorization prior to the service being performed.   For more information and frequently asked questions, please visit our website : [email or web address]  For non-scheduling related questions, please contact the cardiac imaging nurse navigator should you have any questions/concerns: Cardiac Imaging Nurse Navigators Direct Office Dial: [phone]   For scheduling needs, including cancellations and rescheduling, please call Brittany, [phone].   Follow-Up: At [facility], you and your health needs are our priority.  As part of our continuing mission to provide you with exceptional heart care, our providers are all part of one team.  This team includes your primary Cardiologist (physician) and Advanced Practice Providers or APPs (Physician Assistants and Nurse Practitioners) who all work together to provide you with the care you need, when you need it.  Your next appointment:   2 month(s)  Provider:   Alean Kobus, MD    We recommend signing up for the patient portal called MyChart.  Sign up information is provided on this After Visit Summary.  MyChart is used to connect with patients for Virtual Visits (Telemedicine).  Patients are able to view lab/test results, encounter notes, upcoming appointments, etc.  Non-urgent messages can be sent to your provider as well.   To learn more about what you can do with MyChart, go to [email or web address].   Other Instructions None

## 2024-09-10 ENCOUNTER — Other Ambulatory Visit: Payer: Self-pay | Admitting: Obstetrics and Gynecology

## 2024-09-10 DIAGNOSIS — Z1231 Encounter for screening mammogram for malignant neoplasm of breast: Secondary | ICD-10-CM

## 2024-09-27 ENCOUNTER — Ambulatory Visit

## 2024-10-03 ENCOUNTER — Ambulatory Visit
Admission: RE | Admit: 2024-10-03 | Discharge: 2024-10-03 | Disposition: A | Source: Ambulatory Visit | Attending: Obstetrics and Gynecology | Admitting: Obstetrics and Gynecology

## 2024-10-03 DIAGNOSIS — Z1231 Encounter for screening mammogram for malignant neoplasm of breast: Secondary | ICD-10-CM

## 2024-10-15 ENCOUNTER — Other Ambulatory Visit: Payer: Self-pay | Admitting: Family Medicine

## 2024-10-15 DIAGNOSIS — R92343 Mammographic extreme density, bilateral breasts: Secondary | ICD-10-CM

## 2024-10-26 ENCOUNTER — Ambulatory Visit

## 2024-11-01 ENCOUNTER — Ambulatory Visit
